# Patient Record
Sex: Male | Born: 1985 | Race: Black or African American | Hispanic: No | Marital: Single | Smoking: Current some day smoker
Health system: Southern US, Community
[De-identification: ages and names within clinical notes are randomized; demographics above are authoritative.]

## PROBLEM LIST (undated history)

## (undated) ENCOUNTER — Emergency Department (HOSPITAL_COMMUNITY): Admission: EM | Disposition: A | Discharge status: Other Acute Inpatient Hospital | Payer: Self-pay

---
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
## 2010-11-30 ENCOUNTER — Emergency Department (HOSPITAL_COMMUNITY)
Admission: EM | Admit: 2010-11-30 | Discharge: 2010-11-30 | Payer: Self-pay | Source: Home / Self Care | Admitting: Emergency Medicine

## 2010-12-09 ENCOUNTER — Inpatient Hospital Stay (INDEPENDENT_AMBULATORY_CARE_PROVIDER_SITE_OTHER)
Admission: RE | Admit: 2010-12-09 | Discharge: 2010-12-09 | Disposition: A | Discharge status: Home / Self Care | Payer: Self-pay | Source: Ambulatory Visit | Attending: Family Medicine | Admitting: Family Medicine

## 2010-12-09 DIAGNOSIS — F1010 Alcohol abuse, uncomplicated: Secondary | ICD-10-CM

## 2010-12-20 ENCOUNTER — Inpatient Hospital Stay (INDEPENDENT_AMBULATORY_CARE_PROVIDER_SITE_OTHER)
Admission: RE | Admit: 2010-12-20 | Discharge: 2010-12-20 | Disposition: A | Discharge status: Home / Self Care | Payer: Self-pay | Source: Ambulatory Visit | Attending: Family Medicine | Admitting: Family Medicine

## 2010-12-20 DIAGNOSIS — A630 Anogenital (venereal) warts: Secondary | ICD-10-CM

## 2010-12-20 DIAGNOSIS — N342 Other urethritis: Secondary | ICD-10-CM

## 2010-12-20 LAB — RAPID URINE DRUG SCREEN, HOSP PERFORMED
Amphetamines: NOT DETECTED
Barbiturates: NOT DETECTED
Benzodiazepines: NOT DETECTED
Cocaine: NOT DETECTED
Opiates: NOT DETECTED
Tetrahydrocannabinol: NOT DETECTED

## 2010-12-21 LAB — GC/CHLAMYDIA PROBE AMP, GENITAL
Chlamydia, DNA Probe: NEGATIVE
GC Probe Amp, Genital: NEGATIVE

## 2012-02-20 IMAGING — CR DG FOOT COMPLETE 3+V*L*
3 series · 3 of 3 positions shown · non-contrast
Comparison: None.

CLINICAL DATA: Trauma with pain about the medial arch of foot and
heel.

LEFT FOOT - COMPLETE 3+ VIEW

[t foot ap left]
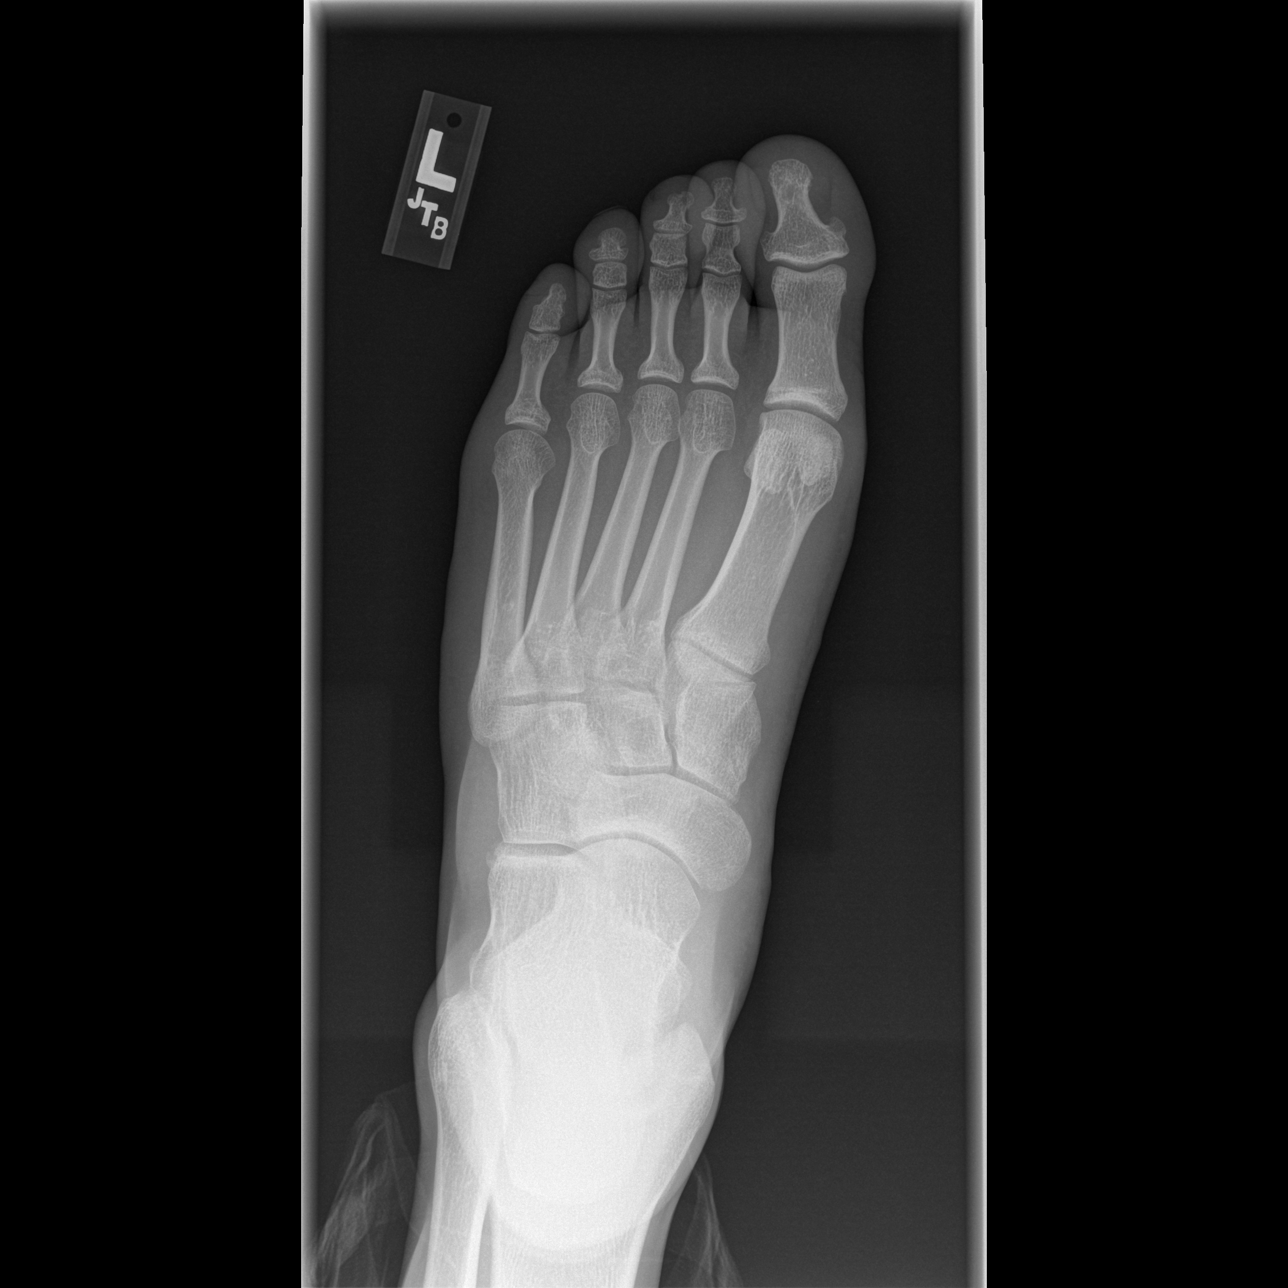

[t foot oblique left]
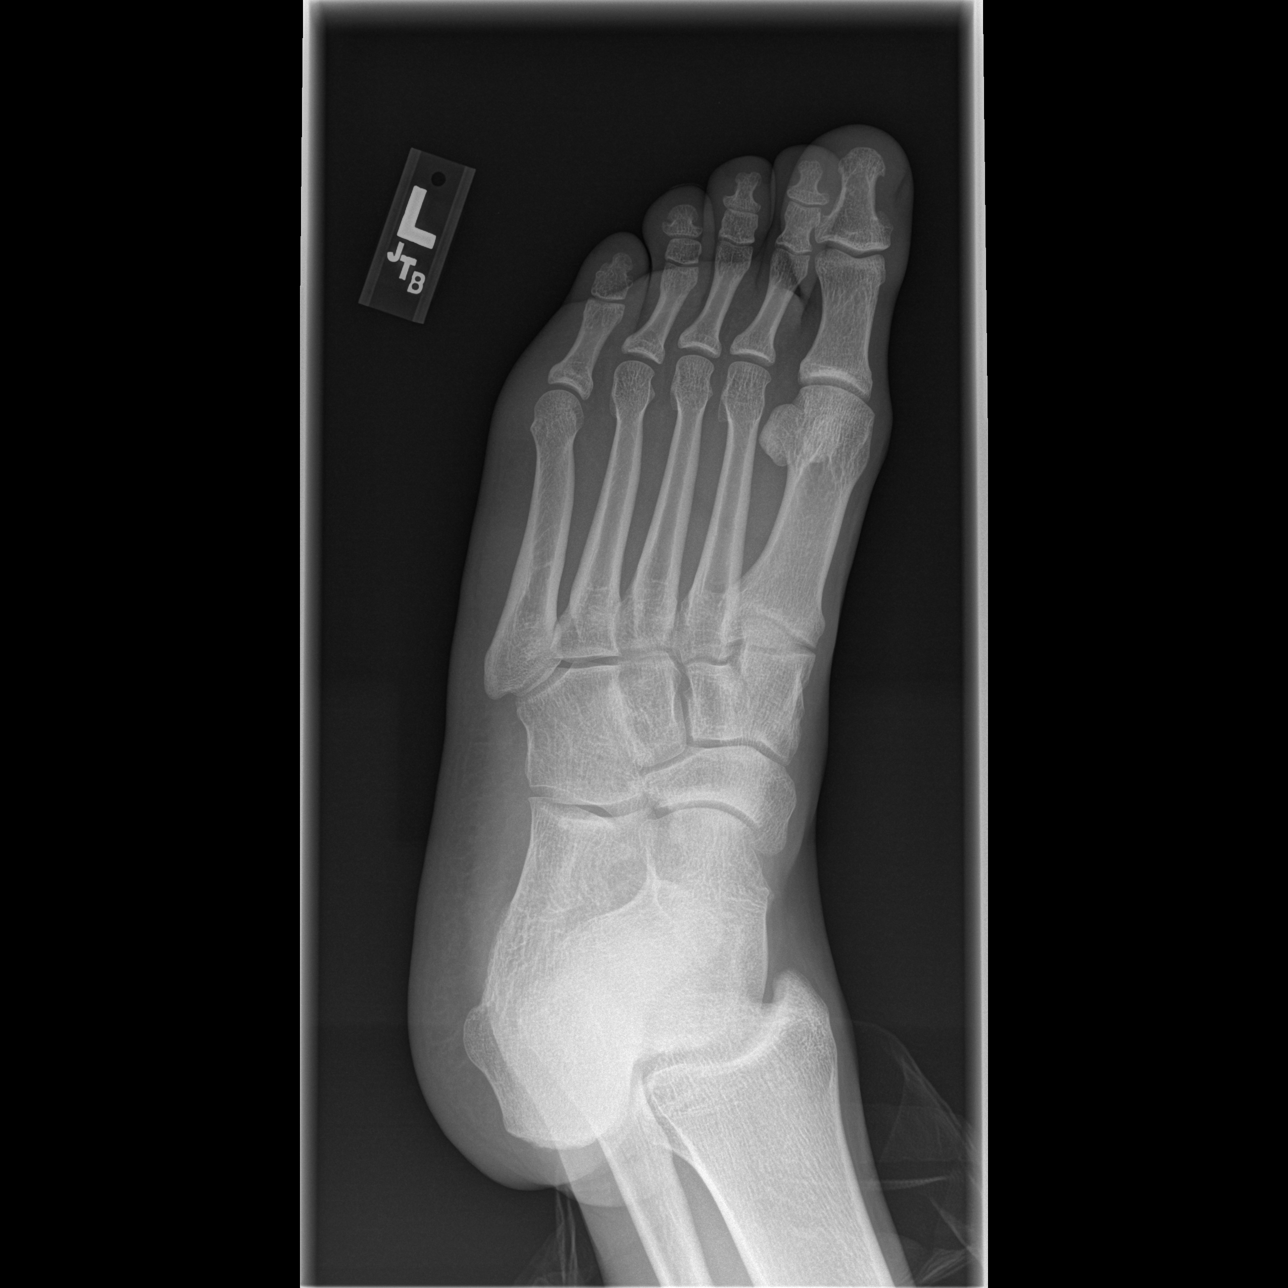

[t foot lat left]
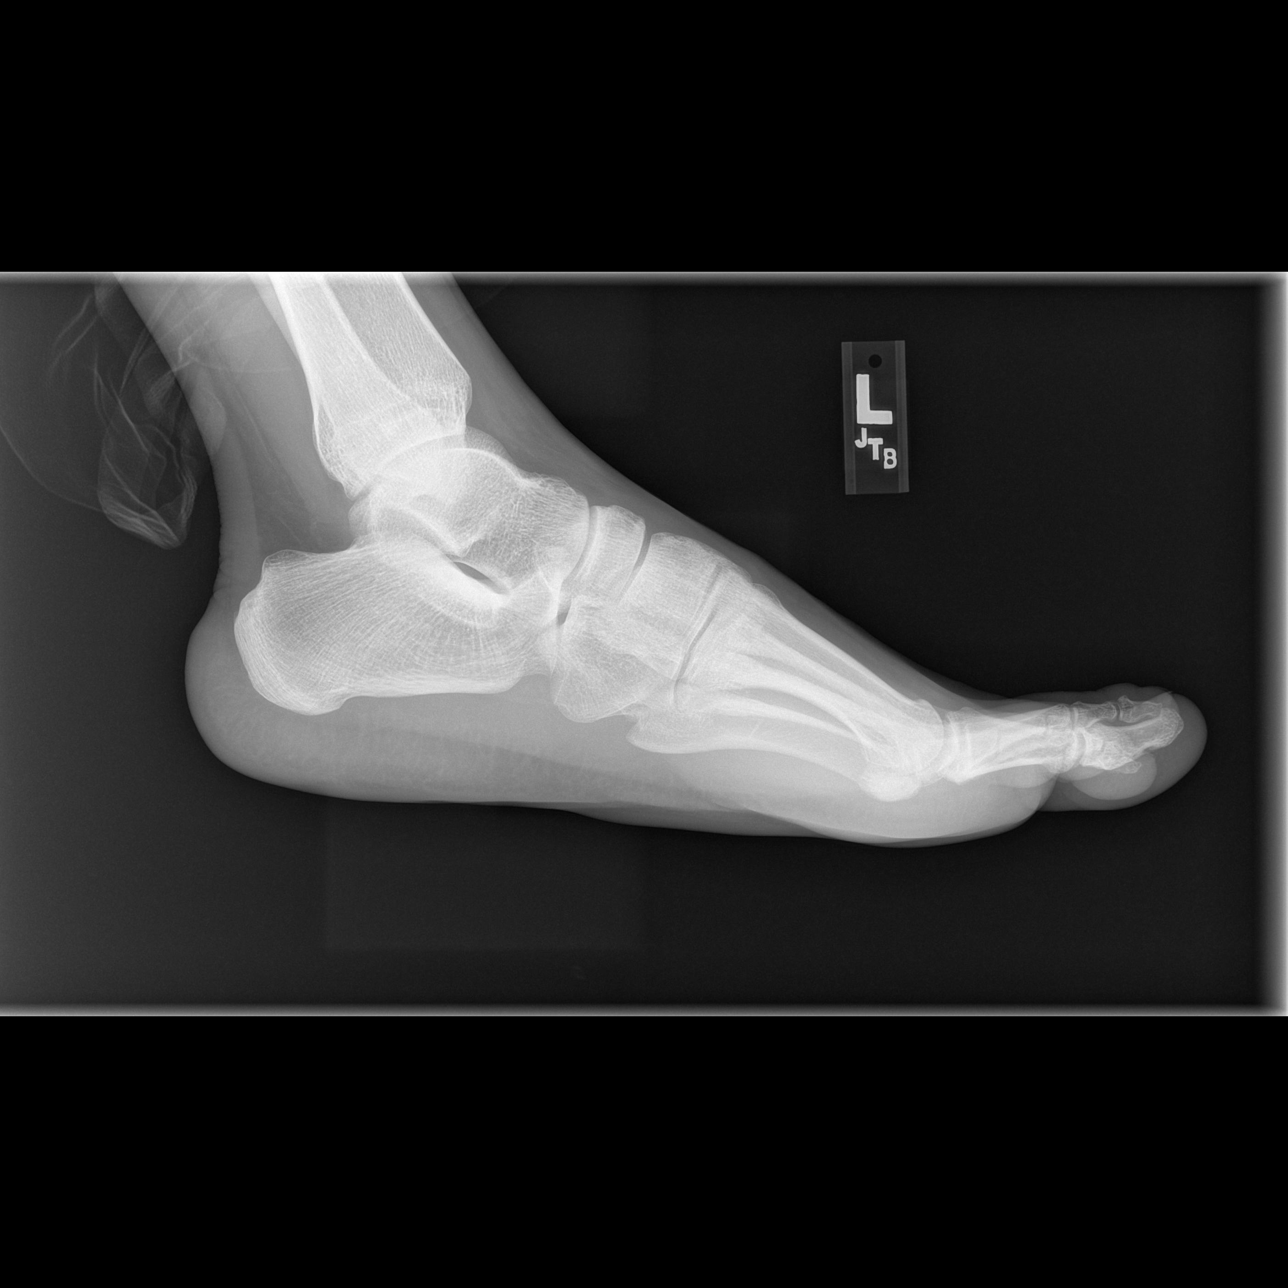

[3 of 3 positions shown; findings below may reference images not displayed]

FINDINGS: No acute fracture or dislocation.  No significant soft
tissue swelling.
IMPRESSION: Normal left foot.

## 2012-02-20 IMAGING — CR DG FOOT COMPLETE 3+V*R*
3 series · 3 of 3 positions shown · non-contrast
Comparison: None.

CLINICAL DATA: Trauma with pain in heels.  Swelling around the
medial arch of the foot.

RIGHT FOOT COMPLETE - 3+ VIEW

[t foot ap right]
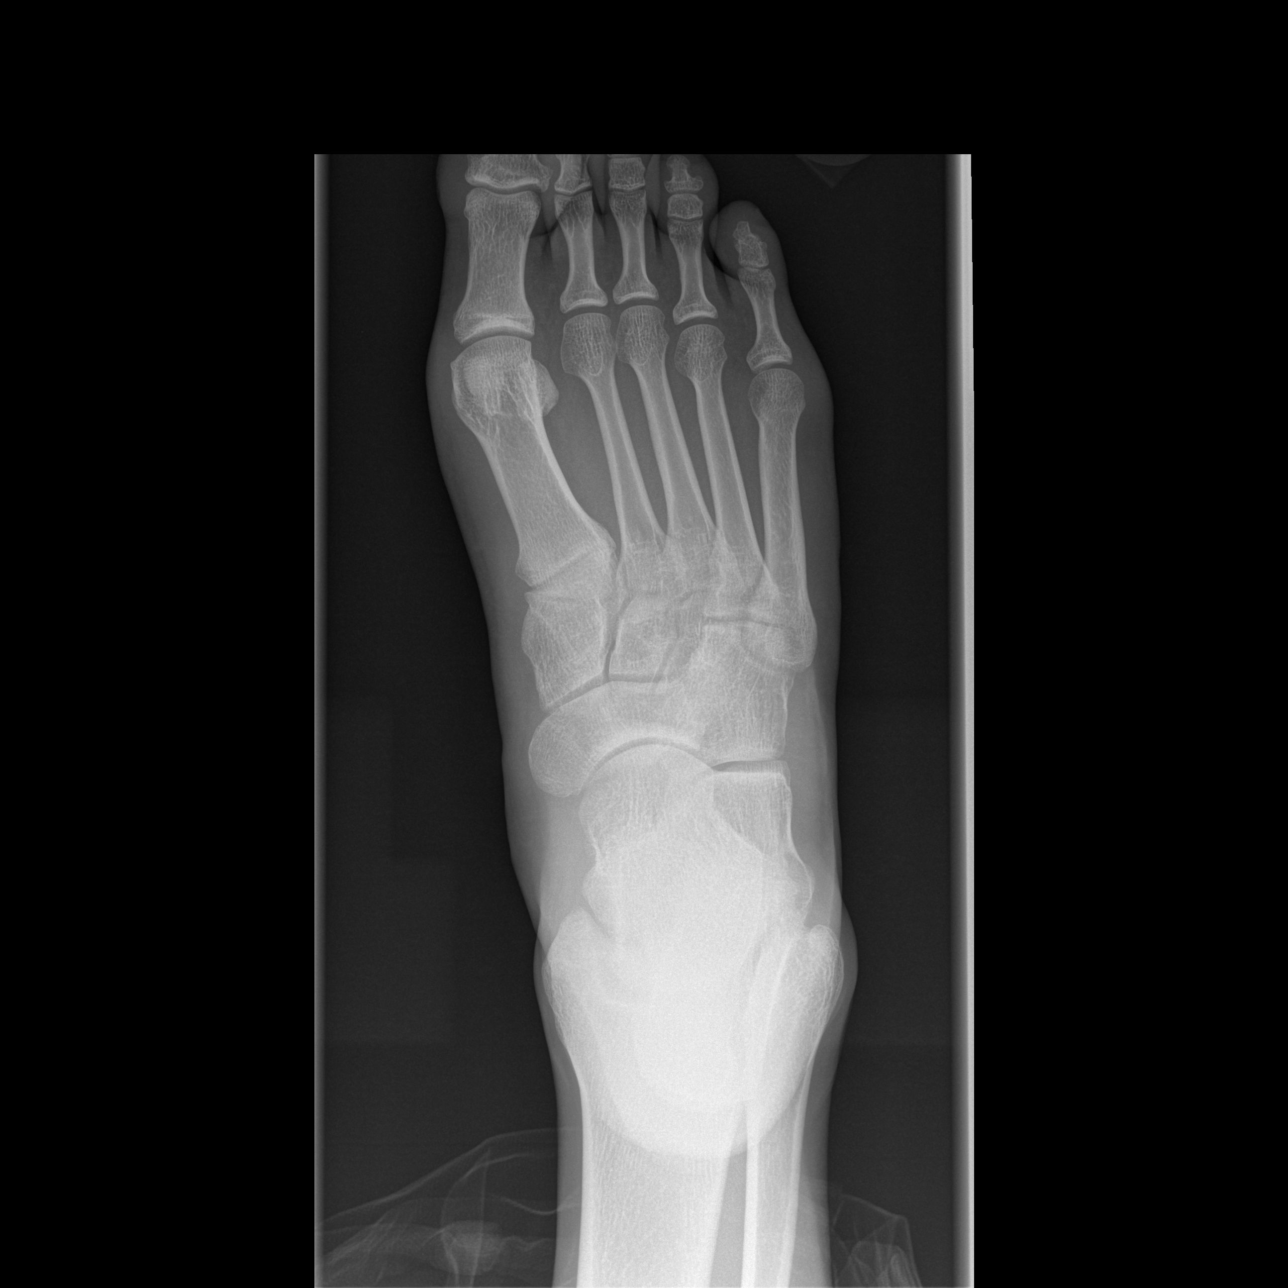

[t foot oblique right]
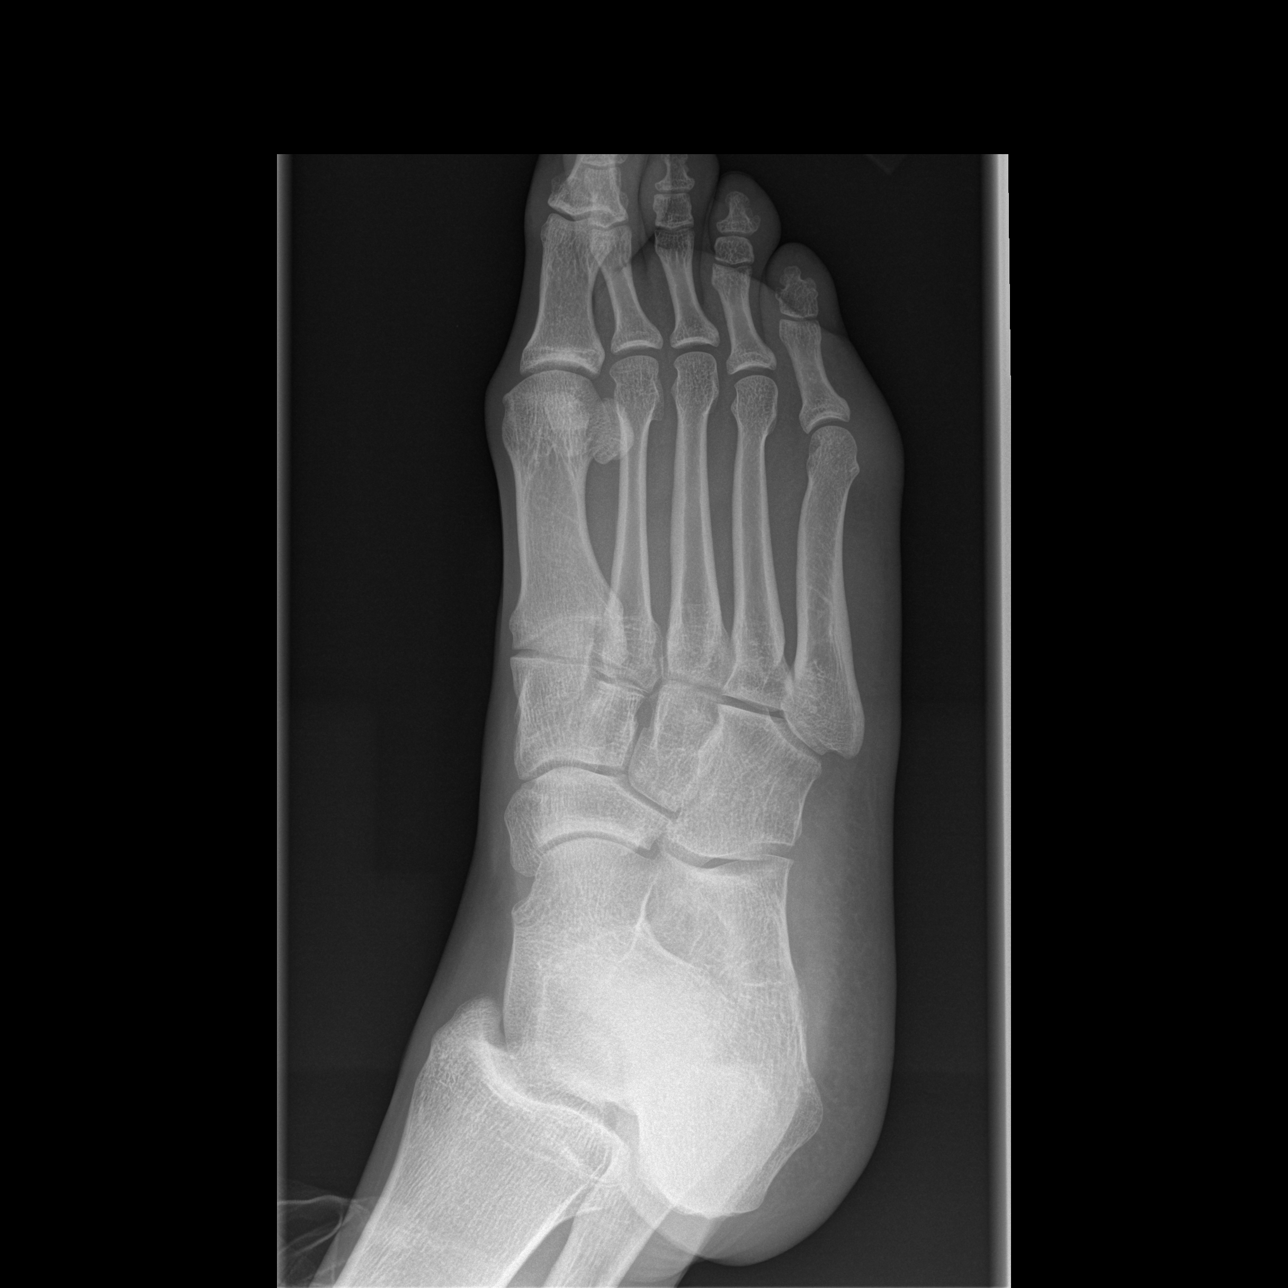

[t foot lat right]
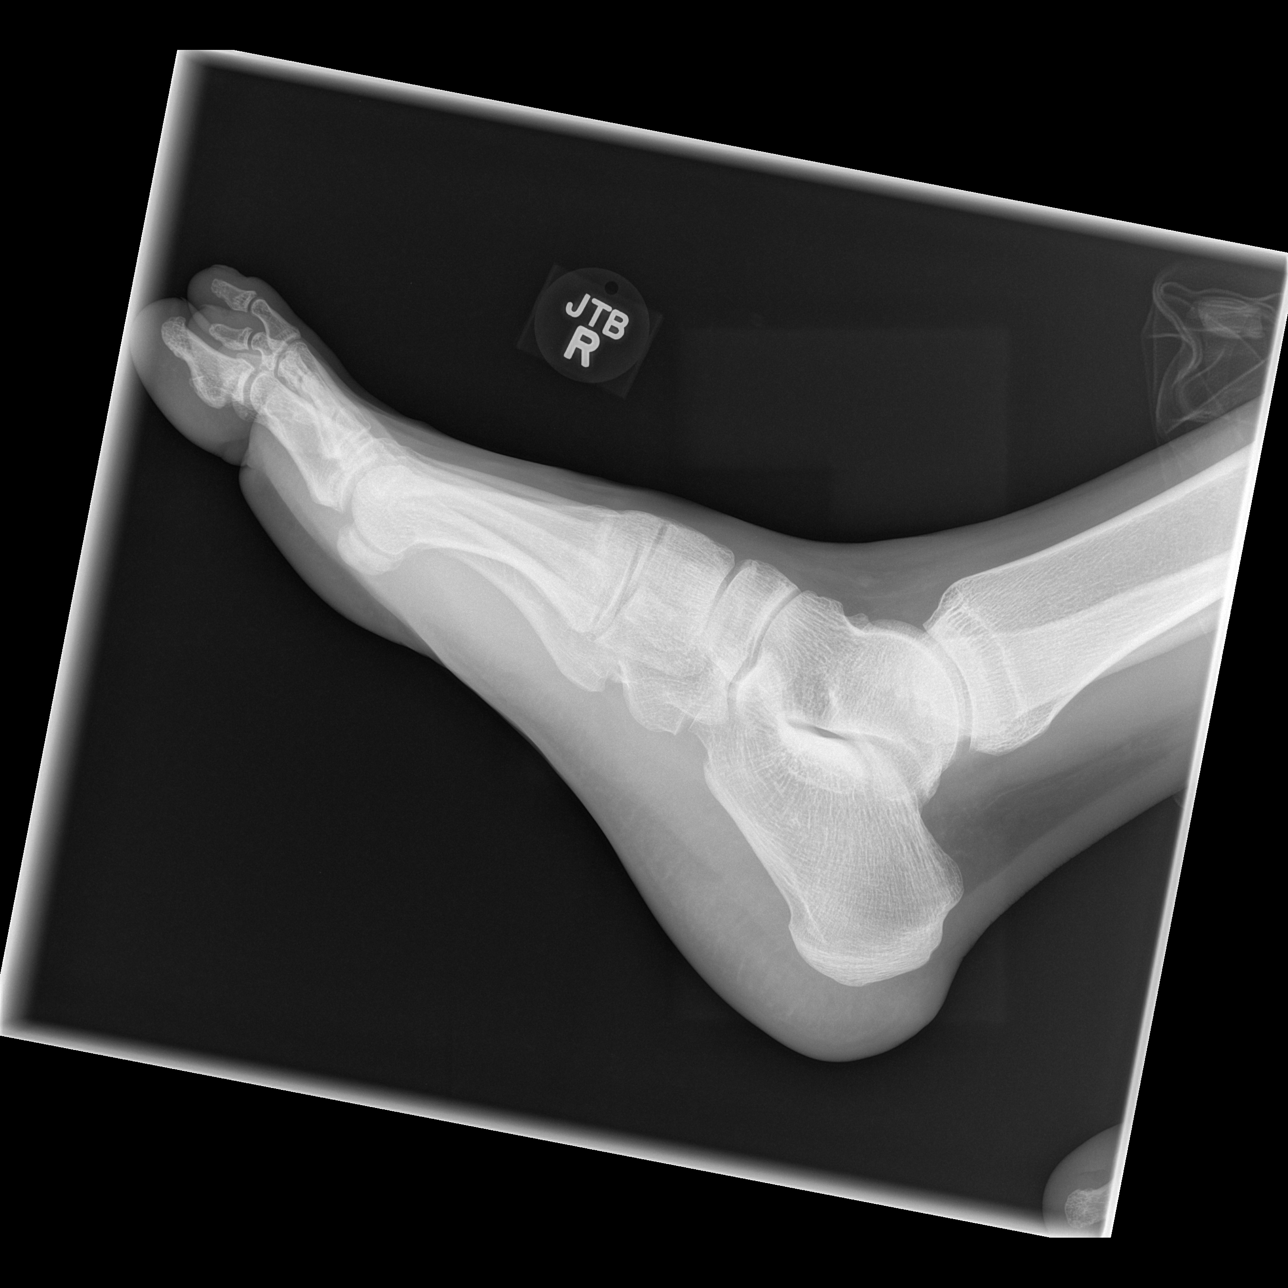

[3 of 3 positions shown; findings below may reference images not displayed]

FINDINGS: No acute fracture or dislocation.  No significant soft
tissue swelling.
IMPRESSION: Normal right foot.

## 2015-04-29 ENCOUNTER — Emergency Department (HOSPITAL_COMMUNITY)
Admission: EM | Admit: 2015-04-29 | Discharge: 2015-04-30 | Disposition: A | Discharge status: Home / Self Care | Payer: Self-pay | Attending: Emergency Medicine | Admitting: Emergency Medicine

## 2015-04-29 ENCOUNTER — Encounter (HOSPITAL_COMMUNITY): Payer: Self-pay | Admitting: Emergency Medicine

## 2015-04-29 DIAGNOSIS — Y998 Other external cause status: Secondary | ICD-10-CM | POA: Insufficient documentation

## 2015-04-29 DIAGNOSIS — W57XXXA Bitten or stung by nonvenomous insect and other nonvenomous arthropods, initial encounter: Secondary | ICD-10-CM | POA: Insufficient documentation

## 2015-04-29 DIAGNOSIS — Y9289 Other specified places as the place of occurrence of the external cause: Secondary | ICD-10-CM | POA: Insufficient documentation

## 2015-04-29 DIAGNOSIS — Z720 Tobacco use: Secondary | ICD-10-CM | POA: Insufficient documentation

## 2015-04-29 DIAGNOSIS — S30862A Insect bite (nonvenomous) of penis, initial encounter: Secondary | ICD-10-CM | POA: Insufficient documentation

## 2015-04-29 DIAGNOSIS — Y9384 Activity, sleeping: Secondary | ICD-10-CM | POA: Insufficient documentation

## 2015-04-29 NOTE — Discharge Instructions (Signed)
1. Medications: usual home medications 2. Treatment: rest, drink plenty of fluids,  3. Follow Up: Please followup with your primary doctor in 2-3 days for discussion of your diagnoses and further evaluation after today's visit; if you do not have a primary care doctor use the resource guide provided to find one; Please return to the ER for for new or worsening symptoms    Bedbugs Bedbugs are tiny bugs that live in and around beds. During the day, they hide in mattresses and other places near beds. They come out at night and bite people lying in bed. They need blood to live and grow. Bedbugs can be found in beds anywhere. Usually, they are found in places where many people come and go (hotels, shelters, hospitals). It does not matter whether the place is dirty or clean. Getting bitten by bedbugs rarely causes a medical problem. The biggest problem can be getting rid of them. This often takes the work of a Oncologist. CAUSES  Less use of pesticides. Bedbugs were common before the 1950s. Then, strong pesticides such as DDT nearly wiped them out. Today, these pesticides are not used because they harm the environment and can cause health problems.  More travel. Besides mattresses, bedbugs can also live in clothing and luggage. They can come along as people travel from place to place. Bedbugs are more common in certain parts of the world. When people travel to those areas, the bugs can come home with them.  Presence of birds and bats. Bedbugs often infest birds and bats. If you have these animals in or near your home, bedbugs may infest your house, too. SYMPTOMS It does not hurt to be bitten by a bedbug. You will probably not wake up when you are bitten. Bedbugs usually bite areas of the skin that are not covered. Symptoms may show when you wake up, or they may take a day or more to show up. Symptoms may include:  Small red bumps on the skin. These might be lined up in a row or clustered in a  group.  A darker red dot in the middle of red bumps.  Blisters on the skin. There may be swelling and very bad itching. These may be signs of an allergic reaction. This does not happen often. DIAGNOSIS Bedbug bites might look and feel like other types of insect bites. The bugs do not stay on the body like ticks or lice. They bite, drop off, and crawl away to hide. Your caregiver will probably:  Ask about your symptoms.  Ask about your recent activities and travel.  Check your skin for bedbug bites.  Ask you to check at home for signs of bedbugs. You should look for:  Spots or stains on the bed or nearby. This could be from bedbugs that were crushed or from their eggs or waste.  Bedbugs themselves. They are reddish-brown, oval, and flat. They do not fly. They are about the size of an apple seed.  Places to look for bedbugs include:  Beds. Check mattresses, headboards, box springs, and bed frames.  On drapes and curtains near the bed.  Under carpeting in the bedroom.  Behind electrical outlets.  Behind any wallpaper that is peeling.  Inside luggage. TREATMENT Most bedbug bites do not need treatment. They usually go away on their own in a few days. The bites are not dangerous. However, treatment may be needed if you have scratched so much that your skin has become infected. You may also  need treatment if you are allergic to bedbug bites. Treatment options include:  A drug that stops swelling and itching (corticosteroid). Usually, a cream is rubbed on the skin. If you have a bad rash, you may be given a corticosteroid pill.  Oral antihistamines. These are pills to help control itching.  Antibiotic medicines. An antibiotic may be prescribed for infected skin. HOME CARE INSTRUCTIONS   Take any medicine prescribed by your caregiver for your bites. Follow the directions carefully.  Consider wearing pajamas with long sleeves and pant legs.  Your bedroom may need to be treated. A  pest control expert should make sure the bedbugs are gone. You may need to throw away mattresses or luggage. Ask the pest control expert what you can do to keep the bedbugs from coming back. Common suggestions include:  Putting a plastic cover over your mattress.  Washing and drying your clothes and bedding in hot water and a hot dryer. The temperature should be hotter than 120 F (48.9 C). Bedbugs are killed by high temperatures.  Vacuuming carefully all around your bed. Vacuum in all cracks and crevices where the bugs might hide. Do this often.  Carefully checking all used furniture, bedding, or clothes that you bring into your house.  Eliminating bird nests and bat roosts.  If you get bedbug bites when traveling, check all your possessions carefully before bringing them into your house. If you find any bugs on clothes or in your luggage, consider throwing those items away. SEEK MEDICAL CARE IF:  You have red bug bites that keep coming back.  You have red bug bites that itch badly.  You have bug bites that cause a skin rash.  You have scratch marks that are red and sore. SEEK IMMEDIATE MEDICAL CARE IF: You have a fever. Document Released: 11/19/2010 Document Revised: 01/09/2012 Document Reviewed: 11/19/2010 [facility] Patient Information 2015 [place], [place]. This information is not intended to replace advice given to you by your health care provider. Make sure you discuss any questions you have with your health care provider.

## 2015-04-29 NOTE — ED Notes (Signed)
Pt. Requesting STD screening reports sexual partner was diagnosed with bacterial vaginosis , denies penile discharge or dysuria .

## 2015-04-29 NOTE — ED Provider Notes (Signed)
CSN: [id number]     Arrival date & time 04/29/15  2244 History   First MD Initiated Contact with Patient 04/29/15 2317     Chief Complaint  Patient presents with  . Exposure to STD     (Consider location/radiation/quality/duration/timing/severity/associated sxs/prior Treatment) Patient is a 29 y.o. male presenting with STD exposure. The history is provided by the patient and medical records. No language interpreter was used.  Exposure to STD Pertinent negatives include no abdominal pain, chest pain, coughing, diaphoresis, fatigue, fever, headaches, nausea, rash or vomiting.     Roberto Peters is a 29 y.o. male  with no major medical Hx presents to the Emergency Departmentwith concerns that he needs to be tested and treated for an STD after his girlfriend was diagnosed with BV.  Pt denies penile discharge, penile pain, testicular pain.  Pt reports he was last tested for STDs in Feb when he was released from jail.  He reports only 1 sexual partner since that time who was recently tested and clean.  He reports that he needs treatment for the BV.  No associated symptoms.  No aggravating or alleviating factors.  Pt denies chills, abdominal pain, nausea, vomiting.    Patient also reports a small lesion on the shaft of the penis present for several weeks. He reports that he slept at a new place and had numerous bug bites on him the next morning including the one on the shaft of the penis. He reports that intermittently itches but has never been painful and has not ulcerated open or been draining.  He reports that he has never had syphilis but has been tested several times.  History reviewed. No pertinent past medical history. History reviewed. No pertinent past surgical history. No family history on file. History  Substance Use Topics  . Smoking status: Current Some Day Smoker  . Smokeless tobacco: Not on file  . Alcohol Use: Yes    Review of Systems  Constitutional: Negative for fever,  diaphoresis, appetite change, fatigue and unexpected weight change.  HENT: Negative for mouth sores.   Eyes: Negative for visual disturbance.  Respiratory: Negative for cough, chest tightness, shortness of breath and wheezing.   Cardiovascular: Negative for chest pain.  Gastrointestinal: Negative for nausea, vomiting, abdominal pain, diarrhea and constipation.  Endocrine: Negative for polydipsia, polyphagia and polyuria.  Genitourinary: Negative for dysuria, urgency, frequency and hematuria.  Musculoskeletal: Negative for back pain and neck stiffness.  Skin: Negative for rash.  Allergic/Immunologic: Negative for immunocompromised state.  Neurological: Negative for syncope, light-headedness and headaches.  Hematological: Does not bruise/bleed easily.  Psychiatric/Behavioral: Negative for sleep disturbance. The patient is not nervous/anxious.       Allergies  Review of patient's allergies indicates no known allergies.  Home Medications   Prior to Admission medications   Not on File   BP 132/102 mmHg  Pulse 98  Temp(Src) 97.9 F (36.6 C) (Oral)  Resp 20  Ht 6\' 2"  (1.88 m)  Wt 193 lb 2 oz (87.601 kg)  BMI 24.79 kg/m2  SpO2 95% Physical Exam  Constitutional: He appears well-developed and well-nourished. No distress.  Awake, alert, nontoxic appearance  HENT:  Head: Normocephalic and atraumatic.  Mouth/Throat: Oropharynx is clear and moist. No oropharyngeal exudate.  Eyes: Conjunctivae are normal. No scleral icterus.  Neck: Normal range of motion. Neck supple.  Cardiovascular: Normal rate, regular rhythm and intact distal pulses.   Pulmonary/Chest:  Equal chest expansion  Abdominal: Soft. Bowel sounds are normal. He exhibits no  mass. There is no tenderness. There is no rebound and no guarding. Hernia confirmed negative in the right inguinal area and confirmed negative in the left inguinal area.  Genitourinary: Testes normal.    Right testis shows no mass, no swelling and no  tenderness. Right testis is descended. Cremasteric reflex is not absent on the right side. Left testis shows no mass, no swelling and no tenderness. Left testis is descended. Cremasteric reflex is not absent on the left side. Circumcised. No phimosis, paraphimosis, hypospadias, penile erythema or penile tenderness. No discharge found.  Musculoskeletal: Normal range of motion. He exhibits no edema.  Lymphadenopathy:       Right: No inguinal adenopathy present.       Left: No inguinal adenopathy present.  Neurological: He is alert.  Speech is clear and goal oriented Moves extremities without ataxia  Skin: Skin is warm and dry. He is not diaphoretic.  Psychiatric: He has a normal mood and affect.  Nursing note and vitals reviewed.   ED Course  Procedures (including critical care time) Labs Review Labs Reviewed - No data to display  Imaging Review No results found.   EKG Interpretation None      MDM   Final diagnoses:  Nonvenomous insect bite of penis, initial encounter   Roberto Peters presents with request for STD testing.  Pt is low risk and his partner is without STD.   Lesion on patient's penis is more consistent with insect bite and less consistent with syphilis. No chancre.  No evidence of secondary infection.  Pt to be d/c home.  Long discussion about BV, treatment and etiology.  No treatment for pt at this time.      [name], PA-C 04/30/15 0102  Loren Racer, MD 04/30/15 [phone]

## 2015-04-30 NOTE — ED Notes (Signed)
Pt stable, ambulatory, states understanding of discharge instructions 

## 2015-11-14 ENCOUNTER — Encounter: Payer: Self-pay | Admitting: Emergency Medicine

## 2015-11-14 ENCOUNTER — Emergency Department
Admission: EM | Admit: 2015-11-14 | Discharge: 2015-11-15 | Payer: Self-pay | Attending: Emergency Medicine | Admitting: Emergency Medicine

## 2015-11-14 DIAGNOSIS — F1310 Sedative, hypnotic or anxiolytic abuse, uncomplicated: Secondary | ICD-10-CM | POA: Insufficient documentation

## 2015-11-14 DIAGNOSIS — R400 Somnolence: Secondary | ICD-10-CM | POA: Insufficient documentation

## 2015-11-14 DIAGNOSIS — F1110 Opioid abuse, uncomplicated: Secondary | ICD-10-CM | POA: Insufficient documentation

## 2015-11-14 DIAGNOSIS — F17200 Nicotine dependence, unspecified, uncomplicated: Secondary | ICD-10-CM | POA: Insufficient documentation

## 2015-11-14 DIAGNOSIS — F1910 Other psychoactive substance abuse, uncomplicated: Secondary | ICD-10-CM

## 2015-11-14 LAB — CBC
HCT: 43.4 % (ref 40.0–52.0)
Hemoglobin: 14.5 g/dL (ref 13.0–18.0)
MCH: 31.1 pg (ref 26.0–34.0)
MCHC: 33.5 g/dL (ref 32.0–36.0)
MCV: 93 fL (ref 80.0–100.0)
PLATELETS: 229 10*3/uL (ref 150–440)
RBC: 4.66 MIL/uL (ref 4.40–5.90)
RDW: 12.5 % (ref 11.5–14.5)
WBC: 9.7 10*3/uL (ref 3.8–10.6)

## 2015-11-14 LAB — SALICYLATE LEVEL

## 2015-11-14 LAB — COMPREHENSIVE METABOLIC PANEL
ALK PHOS: 84 U/L (ref 38–126)
ALT: 16 U/L — AB (ref 17–63)
AST: 26 U/L (ref 15–41)
Albumin: 4.1 g/dL (ref 3.5–5.0)
Anion gap: 8 (ref 5–15)
BUN: 7 mg/dL (ref 6–20)
CALCIUM: 9.1 mg/dL (ref 8.9–10.3)
CHLORIDE: 102 mmol/L (ref 101–111)
CO2: 29 mmol/L (ref 22–32)
CREATININE: 0.85 mg/dL (ref 0.61–1.24)
Glucose, Bld: 89 mg/dL (ref 65–99)
Potassium: 3.9 mmol/L (ref 3.5–5.1)
Sodium: 139 mmol/L (ref 135–145)
TOTAL PROTEIN: 7.3 g/dL (ref 6.5–8.1)
Total Bilirubin: 0.8 mg/dL (ref 0.3–1.2)

## 2015-11-14 LAB — ETHANOL: ALCOHOL ETHYL (B): 65 mg/dL — AB (ref <5)

## 2015-11-14 LAB — ACETAMINOPHEN LEVEL

## 2015-11-14 NOTE — ED Notes (Addendum)
Per [employer] officer pt is suspected of taking oxycodone, xanax and mariajuana and drinking alcohol. Pt denies drug or alcohol use when asked. Pt here for medical clearance to go to jail.

## 2015-11-14 NOTE — ED Notes (Signed)
Pt up to bathroom with [employer] officer to attempt to obtain urine spec.

## 2015-11-14 NOTE — ED Provider Notes (Signed)
[facility] Emergency Department Provider Note  ____________________________________________  Time seen: Approximately 10:34 PM  I have reviewed the triage vital signs and the nursing notes.   HISTORY  Chief Complaint Medical Clearance  EM caveat: Patient refuses to answer many elements of history  HPI Roberto Peters is a 30 y.o. male reports no medical history.  The patient refuses to discuss reason for being brought in for evaluation. He is however awake and alert, follows commands, and answers basic questions and telling me that he has no medical problems, he does not have any allergies, he does smoke cigarettes, and he denies drug use.  Per police who brought him he was taken into custody around 4:30 PM today and was noted to be acting slightly "high" and that they found prescription opioids and Xanax.  He was notably somewhat "high" and seemed a little bit sleepy at the jail and was brought to the ER for evaluation. Please after does report that he seems to be more awake now, but was very sleepy while at the jail. There was not a known attempted overdose or ingestion, but history is unclear.   History reviewed. No pertinent past medical history.  There are no active problems to display for this patient.   History reviewed. No pertinent past surgical history.  No current outpatient prescriptions on file.  Allergies Review of patient's allergies indicates no known allergies.  No family history on file.  Social History Social History  Substance Use Topics  . Smoking status: Current Some Day Smoker  . Smokeless tobacco: Never Used  . Alcohol Use: Yes    Review of Systems Patient denies any drug use or prescription medication use. He denies to me that he is suicidal or attempted overdose. He does not, however tell me what situation low at up to his coming to the emergency room. He refused to answer some questions but denies being in any pain,  discomfort, and tells me that he would like something to eat.  ____________________________________________   PHYSICAL EXAM:  VITAL SIGNS: ED Triage Vitals  Enc Vitals Group     BP 11/14/15 2132 105/79 mmHg     Pulse Rate 11/14/15 2132 74     Resp 11/14/15 2132 14     Temp 11/14/15 2132 98 F (36.7 C)     Temp Source 11/14/15 2132 Oral     SpO2 11/14/15 2132 100 %     Weight 11/14/15 2132 190 lb (86.183 kg)     Height 11/14/15 2132 6\' 2"  (1.88 m)     Head Cir --      Peak Flow --      Pain Score --      Pain Loc --      Pain Edu? --      Excl. in GC? --    Constitutional: Alert and oriented. Well appearing and in no acute distress. In handcuffs with officer. Ambulating normally in the hallway back for the bathroom. Eyes: Conjunctivae are lightly injected. PERRL. EOMI. no pinpoint pupils. Head: Atraumatic. Nose: No congestion/rhinnorhea. Mouth/Throat: Mucous membranes are moist.  Oropharynx non-erythematous. Neck: No stridor.   Cardiovascular: Normal rate, regular rhythm. Grossly normal heart sounds.  Good peripheral circulation. Respiratory: Normal respiratory effort.  No retractions. Lungs CTAB. Gastrointestinal: Soft and nontender. No distention.  Musculoskeletal: No lower extremity tenderness nor edema.  No joint effusions. Neurologic:  Normal speech and language. No gross focal neurologic deficits are appreciated. No gait instability. Skin:  Skin  is warm, dry and intact. No rash noted. Psychiatric: Mood and affect are fairly calm, slightly sedate. Speech and behavior are normal.  ____________________________________________   LABS (all labs ordered are listed, but only abnormal results are displayed)  Labs Reviewed  COMPREHENSIVE METABOLIC PANEL - Abnormal; Notable for the following:    ALT 16 (*)    All other components within normal limits  ETHANOL - Abnormal; Notable for the following:    Alcohol, Ethyl (B) 65 (*)    All other components within normal limits   ACETAMINOPHEN LEVEL - Abnormal; Notable for the following:    Acetaminophen (Tylenol), Serum <10 (*)    All other components within normal limits  CBC  SALICYLATE LEVEL  URINE DRUG SCREEN, QUALITATIVE ([facility] ONLY)   ____________________________________________  EKG   ____________________________________________  RADIOLOGY   ____________________________________________   PROCEDURES  Procedure(s) performed: None  Critical Care performed: No  ____________________________________________   INITIAL IMPRESSION / ASSESSMENT AND PLAN / ED COURSE  Pertinent labs & imaging results that were available during my care of the patient were reviewed by me and considered in my medical decision making (see chart for details).  Patient resents with police. He does not demonstrate evidence of acute overdose, but he is slightly somnolent but is able to walk with stable gait. He refers to rest, but is interactive with exam and refuses to give a significant amount of the history today. He does deny suicidal attempt or overdose however. Based on description given by the police, which appears to be the closest available source for history, there is concern he may have taken prescription medications. He is afebrile with no evidence of acute neurologic abnormality. He is in no distress and has reassuring exam. I we'll send screening labs, drug screen including acetaminophen and salicylate level. We'll observe the patient closely in the ER for signs of sedation and/or improvement.  ----------------------------------------- 11:20 PM on 11/14/2015 -----------------------------------------  Patient sitting up, drinking water. Patient is alert, but still slightly sleepy. Ongoing care and disposition assigned to Dr. Alphonzo LemmingsMcShane. Follow-up on urine drug screen. Reevaluate, and continue to observe in the ER. Anticipate discharge into police custody once return to normal mental  status. ____________________________________________   FINAL CLINICAL IMPRESSION(S) / ED DIAGNOSES  Final diagnoses:  Somnolence  Polysubstance abuse      Sharyn CreamerMark Quale, MD 11/14/15 [phone]

## 2015-11-14 NOTE — ED Notes (Signed)
Pt here with [place] police department for medical clearence for jail. Officer states pt was found with xanax, opiates and "about an hour ago he couldn't stand." pt states "i don't know why i'm here". Pt appears sleepy, sitting upright, alert and oriented to place, person and day.

## 2015-11-14 NOTE — ED Notes (Signed)

## 2015-11-14 NOTE — ED Notes (Signed)
Pt returned to stretcher and was unable to obtain urine specimen

## 2015-11-14 NOTE — ED Notes (Signed)
Pt released from handcuffs and escorted to bathroom by [employer] officer [place] and urine obtained. Urine labeled and to the lab.

## 2015-11-14 NOTE — ED Notes (Signed)
Pt continues to drank water in attempt to urinate.

## 2015-11-15 LAB — URINE DRUG SCREEN, QUALITATIVE (ARMC ONLY)
AMPHETAMINES, UR SCREEN: NOT DETECTED
Barbiturates, Ur Screen: NOT DETECTED
Benzodiazepine, Ur Scrn: POSITIVE — AB
CANNABINOID 50 NG, UR ~~LOC~~: NOT DETECTED
COCAINE METABOLITE, UR ~~LOC~~: NOT DETECTED
MDMA (ECSTASY) UR SCREEN: NOT DETECTED
Methadone Scn, Ur: NOT DETECTED
Opiate, Ur Screen: NOT DETECTED
Phencyclidine (PCP) Ur S: NOT DETECTED
TRICYCLIC, UR SCREEN: NOT DETECTED

## 2015-11-15 NOTE — ED Notes (Signed)
BEHAVIORAL HEALTH ROUNDING  Patient sleeping: No.  Patient alert and oriented: yes  Behavior appropriate: Yes. ; If no, describe:  Nutrition and fluids offered: Yes  Toileting and hygiene offered: Yes  Sitter present: not applicable, Q 15 min safety rounds and observation.  Law enforcement present: Yes ODS  

## 2015-11-15 NOTE — ED Provider Notes (Signed)
-----------------------------------------   12:28 AM on 11/15/2015 -----------------------------------------  Patient was sent in here for being somnolent under custody of police after being arrested apparently. He is at this time awake alert eating a sandwich and in no acute distress. No evidence of closed head injury. Per signout, a few DS and Tylenol and salicylates are reassuring patient can go assuming his exam is reassuring which it is. We'll discharge him with outpatient referral for substance abuse. Patient will be discharged in the custody of the police.  Jeanmarie PlantJames A Cambria Osten, MD 11/15/15 [phone]

## 2015-11-15 NOTE — Discharge Instructions (Signed)
Polysubstance Abuse °When people abuse more than one drug or type of drug it is called polysubstance or polydrug abuse. For example, many smokers also drink alcohol. This is one form of polydrug abuse. Polydrug abuse also refers to the use of a drug to counteract an unpleasant effect produced by another drug. It may also be used to help with withdrawal from another drug. People who take stimulants may become agitated. Sometimes this agitation is countered with a tranquilizer. This helps protect against the unpleasant side effects. Polydrug abuse also refers to the use of different drugs at the same time.  °Anytime drug use is interfering with normal living activities, it has become abuse. This includes problems with family and friends. Psychological dependence has developed when your mind tells you that the drug is needed. This is usually followed by physical dependence which has developed when continuing increases of drug are required to get the same feeling or "high". This is known as addiction or chemical dependency. A person's risk is much higher if there is a history of chemical dependency in the family. °SIGNS OF CHEMICAL DEPENDENCY °· You have been told by friends or family that drugs have become a problem. °· You fight when using drugs. °· You are having blackouts (not remembering what you do while using). °· You feel sick from using drugs but continue using. °· You lie about use or amounts of drugs (chemicals) used. °· You need chemicals to get you going. °· You are suffering in work performance or in school because of drug use. °· You get sick from use of drugs but continue to use anyway. °· You need drugs to relate to people or feel comfortable in social situations. °· You use drugs to forget problems. °"Yes" answered to any of the above signs of chemical dependency indicates there are problems. The longer the use of drugs continues, the greater the problems will become. °If there is a family history of  drug or alcohol use, it is best not to experiment with these drugs. Continual use leads to tolerance. After tolerance develops more of the drug is needed to get the same feeling. This is followed by addiction. With addiction, drugs become the most important part of life. It becomes more important to take drugs than participate in the other usual activities of life. This includes relating to friends and family. Addiction is followed by dependency. Dependency is a condition where drugs are now needed not just to get high, but to feel normal. °Addiction cannot be cured but it can be stopped. This often requires outside help and the care of professionals. Treatment centers are listed in the yellow pages under: Cocaine, Narcotics, and Alcoholics Anonymous. Most hospitals and clinics can refer you to a specialized care center. Talk to your caregiver if you need help. °  °This information is not intended to replace advice given to you by your health care provider. Make sure you discuss any questions you have with your health care provider. °  °Document Released: 06/08/2005 Document Revised: 01/09/2012 Document Reviewed: 10/22/2014 °Elsevier Interactive Patient Education ©2016 [employer]. ° °

## 2016-03-02 ENCOUNTER — Emergency Department (HOSPITAL_COMMUNITY)
Admission: EM | Admit: 2016-03-02 | Discharge: 2016-03-31 | Disposition: E | Payer: Self-pay | Attending: Emergency Medicine | Admitting: Emergency Medicine

## 2016-03-02 DIAGNOSIS — Y998 Other external cause status: Secondary | ICD-10-CM | POA: Insufficient documentation

## 2016-03-02 DIAGNOSIS — S21302A Unspecified open wound of left front wall of thorax with penetration into thoracic cavity, initial encounter: Secondary | ICD-10-CM | POA: Insufficient documentation

## 2016-03-02 DIAGNOSIS — J942 Hemothorax: Secondary | ICD-10-CM

## 2016-03-02 DIAGNOSIS — I469 Cardiac arrest, cause unspecified: Secondary | ICD-10-CM | POA: Insufficient documentation

## 2016-03-02 DIAGNOSIS — W3400XA Accidental discharge from unspecified firearms or gun, initial encounter: Secondary | ICD-10-CM | POA: Insufficient documentation

## 2016-03-02 DIAGNOSIS — S271XXA Traumatic hemothorax, initial encounter: Secondary | ICD-10-CM | POA: Insufficient documentation

## 2016-03-02 DIAGNOSIS — Y9389 Activity, other specified: Secondary | ICD-10-CM | POA: Insufficient documentation

## 2016-03-02 DIAGNOSIS — Y9289 Other specified places as the place of occurrence of the external cause: Secondary | ICD-10-CM | POA: Insufficient documentation

## 2016-03-02 LAB — PREPARE FRESH FROZEN PLASMA
UNIT DIVISION: 0
Unit division: 0

## 2016-03-02 LAB — BLOOD PRODUCT ORDER (VERBAL) VERIFICATION

## 2016-03-02 NOTE — Progress Notes (Signed)
   2015-12-18 1711  Clinical Encounter Type  Visited With Family;Other (Comment)  Visit Type Follow-up;Death  Referral From Nurse   Chaplain assisted with patient's grandparents and uncle receiving an update from police detective. Chaplain support available as needed.   Alda PonderAdam M Emilianna Barlowe, Chaplain 2015-11-12 5:11 PM
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
## 2016-03-02 NOTE — ED Notes (Signed)
 1mg  of intracardiac Epi given by MD Lindie SpruceWyatt directly into cardiac muscle
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
## 2016-03-02 NOTE — ED Notes (Signed)
Heart appears to be in V-fib per MD Wyatt, Pt internally shocked at 15.
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
## 2016-03-02 NOTE — ED Notes (Signed)
First unit of blood started Unit number [id number] O-neg

## 2016-03-02 NOTE — ED Notes (Signed)
Pt belongings all placed in paper bags including. 1 f boxers, 1  jeans, 1 white t-shirt, 2 socks, 1 red shirt, 1 black and red pants, 1 pack of [place] cigarettes, 1 pair of shoes, eight 20 dollar bills, one 5 dollar bill, three 1 dollar bills, 1 bus pass, 1 black cell phone.

## 2016-03-02 NOTE — ED Notes (Addendum)
MD Lindie SpruceWyatt assessing pt with US at this time. Thoracotomy equipment being prepared at this time.

## 2016-03-02 NOTE — ED Notes (Signed)
Pts Heart appears to be in v-fib per MD Wyatt. Pt internally defibed at 30J.

## 2016-03-03 LAB — TYPE AND SCREEN
UNIT DIVISION: 0
Unit division: 0

## 2016-03-31 DEATH — deceased
